# Patient Record
Sex: Male | Born: 1990 | Hispanic: Yes | Marital: Single | Smoking: Former smoker
Health system: Southern US, Community
[De-identification: ages and names within clinical notes are randomized; demographics above are authoritative.]

## PROBLEM LIST (undated history)

## (undated) HISTORY — PX: TESTICLE REMOVAL: SHX68

---

## 2019-04-29 ENCOUNTER — Other Ambulatory Visit: Payer: Self-pay

## 2019-04-29 ENCOUNTER — Encounter: Payer: Self-pay | Admitting: Emergency Medicine

## 2019-04-29 ENCOUNTER — Emergency Department
Admission: EM | Admit: 2019-04-29 | Discharge: 2019-04-30 | Disposition: A | Discharge status: Home / Self Care | Payer: BC Managed Care – PPO | Attending: Emergency Medicine | Admitting: Emergency Medicine

## 2019-04-29 ENCOUNTER — Emergency Department: Payer: BC Managed Care – PPO

## 2019-04-29 DIAGNOSIS — K219 Gastro-esophageal reflux disease without esophagitis: Secondary | ICD-10-CM | POA: Insufficient documentation

## 2019-04-29 DIAGNOSIS — R079 Chest pain, unspecified: Secondary | ICD-10-CM

## 2019-04-29 DIAGNOSIS — R0789 Other chest pain: Secondary | ICD-10-CM | POA: Diagnosis present

## 2019-04-29 LAB — BASIC METABOLIC PANEL
Anion gap: 10 (ref 5–15)
BUN: 12 mg/dL (ref 6–20)
CO2: 22 mmol/L (ref 22–32)
Calcium: 9.2 mg/dL (ref 8.9–10.3)
Chloride: 105 mmol/L (ref 98–111)
Creatinine, Ser: 0.9 mg/dL (ref 0.61–1.24)
GFR calc Af Amer: >60 mL/min (ref >60)
GFR calc non Af Amer: >60 mL/min (ref >60)
Glucose, Bld: 102 mg/dL — ABNORMAL HIGH (ref 70–99)
Potassium: 3.9 mmol/L (ref 3.5–5.1)
Sodium: 137 mmol/L (ref 135–145)

## 2019-04-29 LAB — CBC
HCT: 51.1 % (ref 39.0–52.0)
Hemoglobin: 17.8 g/dL — ABNORMAL HIGH (ref 13.0–17.0)
MCH: 30.5 pg (ref 26.0–34.0)
MCHC: 34.8 g/dL (ref 30.0–36.0)
MCV: 87.5 fL (ref 80.0–100.0)
Platelets: 290 10*3/uL (ref 150–400)
RBC: 5.84 MIL/uL — ABNORMAL HIGH (ref 4.22–5.81)
RDW: 12 % (ref 11.5–15.5)
WBC: 10.4 10*3/uL (ref 4.0–10.5)
nRBC: 0 % (ref 0.0–0.2)

## 2019-04-29 LAB — TROPONIN I (HIGH SENSITIVITY): Troponin I (High Sensitivity): 3 ng/L (ref <18)

## 2019-04-29 MED ORDER — SODIUM CHLORIDE 0.9 % IV BOLUS
1000.0000 mL | Freq: Once | INTRAVENOUS | Status: AC
  Administered 2019-04-30: 1000 mL via INTRAVENOUS

## 2019-04-29 NOTE — ED Triage Notes (Signed)
Pt arrives with complaints  of mid sternum chest pain that started 3 weeks prior. Pt reports today he felt light headed and short of breath.

## 2019-04-29 NOTE — ED Provider Notes (Signed)
[facility] Emergency Department Provider Note   ____________________________________________   None    (approximate)  I have reviewed the triage vital signs and the nursing notes.   HISTORY  Chief Complaint Chest Pain    HPI Phillip Rose is a 28 y.o. male who presents to the ED from home with a chief complaint of chest pain. Symptoms x 3 weeks; sharp substernal chest pain radiating to the right side. Single episode of vomiting with bloody streaks 2 weeks ago.  Seems to be worse after eating spicy foods.  Occasional cough. Denies fever, sob, abdominal pain, dysuria, diarrhea.       Past Medical History None   There are no active problems to display for this patient.   Past Surgical History:  Procedure Laterality Date  . TESTICLE REMOVAL      Prior to Admission medications   Not on File    Allergies Patient has no allergy information on record.  No family history on file.  Social History Social History   Tobacco Use  . Smoking status: Not on file  Substance Use Topics  . Alcohol use: Not on file  . Drug use: Not on file    Review of Systems  Constitutional: No fever/chills Eyes: No visual changes. ENT: No sore throat. Cardiovascular: Positive for chest pain. Respiratory: Denies shortness of breath. Gastrointestinal: No abdominal pain.  No nausea, no vomiting.  No diarrhea.  No constipation. Genitourinary: Negative for dysuria. Musculoskeletal: Negative for back pain. Skin: Negative for rash. Neurological: Negative for headaches, focal weakness or numbness.   ____________________________________________   PHYSICAL EXAM:  VITAL SIGNS: ED Triage Vitals  Enc Vitals Group     BP 04/29/19 2026 (!) 148/83     Pulse Rate 04/29/19 2026 84     Resp 04/29/19 2026 17     Temp 04/29/19 2026 99 F (37.2 C)     Temp Source 04/29/19 2026 Oral     SpO2 04/29/19 2026 98 %     Weight 04/29/19 2023 210 lb (95.3 kg)     Height  04/29/19 2023 5\' 5"  (1.651 m)     Head Circumference --      Peak Flow --      Pain Score 04/29/19 2022 7     Pain Loc --      Pain Edu? --      Excl. in GC? --     Constitutional: Alert and oriented. Well appearing and in no acute distress. Eyes: Conjunctivae are normal. PERRL. EOMI. Head: Atraumatic. Nose: No congestion/rhinnorhea. Mouth/Throat: Mucous membranes are moist.  Oropharynx non-erythematous. Neck: No stridor.   Cardiovascular: Normal rate, regular rhythm. Grossly normal heart sounds.  Good peripheral circulation. Respiratory: Normal respiratory effort.  No retractions. Lungs CTAB. Gastrointestinal: Soft and nontender. No distention. No abdominal bruits. No CVA tenderness. Musculoskeletal: No lower extremity tenderness nor edema.  No joint effusions. Neurologic:  Normal speech and language. No gross focal neurologic deficits are appreciated. No gait instability. Skin:  Skin is warm, dry and intact. No rash noted. Psychiatric: Mood and affect are normal. Speech and behavior are normal.  ____________________________________________   LABS (all labs ordered are listed, but only abnormal results are displayed)  Labs Reviewed  BASIC METABOLIC PANEL - Abnormal; Notable for the following components:      Result Value   Glucose, Bld 102 (*)    All other components within normal limits  CBC - Abnormal; Notable for the following components:   RBC  5.84 (*)    Hemoglobin 17.8 (*)    All other components within normal limits  HEPATIC FUNCTION PANEL - Abnormal; Notable for the following components:   Total Protein 8.6 (*)    ALT 46 (*)    All other components within normal limits  LIPASE, BLOOD  TROPONIN I (HIGH SENSITIVITY)  TROPONIN I (HIGH SENSITIVITY)   ____________________________________________  EKG  ED ECG REPORT I, SUNG,JADE J, the attending physician, personally viewed and interpreted this ECG.   Date: 04/30/2019  EKG Time: 2024  Rate: 107  Rhythm: sinus  tachycardia  Axis: Normal  Intervals:none  ST&T Change: Nonspecific  ____________________________________________  RADIOLOGY  ED MD interpretation: No acute cardiopulmonary process; no PE  Official radiology report(s): Ct Angio Chest Pe W/cm &/or Wo Cm  Result Date: 04/30/2019 CLINICAL DATA:  Chest pain and lightheadedness. EXAM: CT ANGIOGRAPHY CHEST WITH CONTRAST TECHNIQUE: Multidetector CT imaging of the chest was performed using the standard protocol during bolus administration of intravenous contrast. Multiplanar CT image reconstructions and MIPs were obtained to evaluate the vascular anatomy. CONTRAST:  1102mL OMNIPAQUE IOHEXOL 350 MG/ML SOLN COMPARISON:  None. FINDINGS: Cardiovascular: Contrast injection is sufficient to demonstrate satisfactory opacification of the pulmonary arteries to the segmental level. There is no pulmonary embolus. The main pulmonary artery is within normal limits for size. There is no CT evidence of acute right heart strain. The visualized aorta is normal. Heart size is normal, without pericardial effusion. Mediastinum/Nodes: --No mediastinal or hilar lymphadenopathy. --No axillary lymphadenopathy. --No supraclavicular lymphadenopathy. --Normal thyroid gland. --The esophagus is unremarkable Lungs/Pleura: There are multiple calcified granulomas in the right upper lobe. There is no pneumothorax. No pleural effusion. No focal infiltrate. Upper Abdomen: No acute abnormality. Musculoskeletal: No chest wall abnormality. No acute or significant osseous findings. Review of the MIP images confirms the above findings. IMPRESSION: 1. No pulmonary embolism. 2. No acute abnormality detected. Electronically Signed   By: Constance Holster M.D.   On: 04/30/2019 00:48   Dg Chest Port 1 View  Result Date: 04/29/2019 CLINICAL DATA:  Chest pain EXAM: PORTABLE CHEST 1 VIEW COMPARISON:  None. FINDINGS: The heart size and mediastinal contours are within normal limits. Both lungs are  clear. The visualized skeletal structures are unremarkable. IMPRESSION: No active disease. Electronically Signed   By: Ulyses Jarred M.D.   On: 04/29/2019 23:57    ____________________________________________   PROCEDURES  Procedure(s) performed (including Critical Care):  Procedures   ____________________________________________   INITIAL IMPRESSION / ASSESSMENT AND PLAN / ED COURSE  As part of my medical decision making, I reviewed the following data within the [facility] notes reviewed and incorporated, Labs reviewed, EKG interpreted, Old chart reviewed, Radiograph reviewed and Notes from prior ED visits     Phillip Rose was evaluated in Emergency Department on 04/30/2019 for the symptoms described in the history of present illness. He was evaluated in the context of the global COVID-19 pandemic, which necessitated consideration that the patient might be at risk for infection with the SARS-CoV-2 virus that causes COVID-19. Institutional protocols and algorithms that pertain to the evaluation of patients at risk for COVID-19 are in a state of rapid change based on information released by regulatory bodies including the CDC and federal and state organizations. These policies and algorithms were followed during the patient's care in the ED.    28 year old male who presents with a 3-week history of chest pain. Differential diagnosis includes, but is not limited to, ACS, aortic dissection, pulmonary embolism,  cardiac tamponade, pneumothorax, pneumonia, pericarditis, myocarditis, GI-related causes including esophagitis/gastritis, and musculoskeletal chest wall pain.    Will check LFTs, lipase.  Administer IV fluids, IV Pepcid.  Obtain CT chest to evaluate for PE.   Clinical Course as of Apr 30 131  Fri Apr 29, 2019  2215 WBC: 10.4 [KP]  Sat Apr 30, 2019  0131 Updated patient and his mother of all test results.  Will discharge home on Pepcid as needed.  Strict  return precautions given.  Both verbalized understanding and agree with plan of care.   [JS]    Clinical Course User Index [JS] Irean Hong, MD [KP] Minna Antis, MD     ____________________________________________   FINAL CLINICAL IMPRESSION(S) / ED DIAGNOSES  Final diagnoses:  Nonspecific chest pain  Gastroesophageal reflux disease, unspecified whether esophagitis present     ED Discharge Orders    None       Note:  This document was prepared using Dragon voice recognition software and may include unintentional dictation errors.   Irean Hong, MD 04/30/19 [phone]

## 2019-04-30 ENCOUNTER — Emergency Department: Payer: BC Managed Care – PPO

## 2019-04-30 LAB — HEPATIC FUNCTION PANEL
ALT: 46 U/L — ABNORMAL HIGH (ref 0–44)
AST: 25 U/L (ref 15–41)
Albumin: 4.7 g/dL (ref 3.5–5.0)
Alkaline Phosphatase: 74 U/L (ref 38–126)
Bilirubin, Direct: 0.1 mg/dL (ref 0.0–0.2)
Indirect Bilirubin: 0.7 mg/dL (ref 0.3–0.9)
Total Bilirubin: 0.8 mg/dL (ref 0.3–1.2)
Total Protein: 8.6 g/dL — ABNORMAL HIGH (ref 6.5–8.1)

## 2019-04-30 LAB — TROPONIN I (HIGH SENSITIVITY): Troponin I (High Sensitivity): 3 ng/L (ref <18)

## 2019-04-30 LAB — LIPASE, BLOOD: Lipase: 34 U/L (ref 11–51)

## 2019-04-30 MED ORDER — IOHEXOL 350 MG/ML SOLN
100.0000 mL | Freq: Once | INTRAVENOUS | Status: AC | PRN
  Administered 2019-04-30: 100 mL via INTRAVENOUS

## 2019-04-30 MED ORDER — FAMOTIDINE 20 MG PO TABS: 20.0000 mg | ORAL_TABLET | Freq: Two times a day (BID) | ORAL | 0 refills | Status: DC

## 2019-04-30 MED ORDER — FAMOTIDINE IN NACL 20-0.9 MG/50ML-% IV SOLN
20.0000 mg | Freq: Once | INTRAVENOUS | Status: AC
  Administered 2019-04-30: 20 mg via INTRAVENOUS
  Filled 2019-04-30: qty 50

## 2019-04-30 NOTE — ED Notes (Signed)
Family at bedside. 

## 2019-04-30 NOTE — ED Notes (Signed)
Fluids to be finished prior to DC - approx 262mls remain

## 2019-04-30 NOTE — Discharge Instructions (Addendum)
1.  Start Pepcid 20 mg twice daily. 2.  Return to the ER for worsening symptoms, persistent vomiting, difficulty breathing or other concerns.

## 2020-07-24 ENCOUNTER — Emergency Department
Admission: EM | Admit: 2020-07-24 | Discharge: 2020-07-24 | Discharge status: Against Medical Advice | Payer: BC Managed Care – PPO | Attending: Student in an Organized Health Care Education/Training Program | Admitting: Student in an Organized Health Care Education/Training Program

## 2020-07-24 ENCOUNTER — Other Ambulatory Visit: Payer: Self-pay

## 2020-07-24 ENCOUNTER — Encounter: Payer: Self-pay | Admitting: *Deleted

## 2020-07-24 ENCOUNTER — Emergency Department: Payer: BC Managed Care – PPO

## 2020-07-24 DIAGNOSIS — R1012 Left upper quadrant pain: Secondary | ICD-10-CM | POA: Diagnosis present

## 2020-07-24 LAB — COMPREHENSIVE METABOLIC PANEL
ALT: 50 U/L — ABNORMAL HIGH (ref 0–44)
AST: 28 U/L (ref 15–41)
Albumin: 4.3 g/dL (ref 3.5–5.0)
Alkaline Phosphatase: 71 U/L (ref 38–126)
Anion gap: 9 (ref 5–15)
BUN: 14 mg/dL (ref 6–20)
CO2: 29 mmol/L (ref 22–32)
Calcium: 9.4 mg/dL (ref 8.9–10.3)
Chloride: 103 mmol/L (ref 98–111)
Creatinine, Ser: 1.04 mg/dL (ref 0.61–1.24)
GFR, Estimated: >60 mL/min (ref >60)
Glucose, Bld: 96 mg/dL (ref 70–99)
Potassium: 4 mmol/L (ref 3.5–5.1)
Sodium: 141 mmol/L (ref 135–145)
Total Bilirubin: 1 mg/dL (ref 0.3–1.2)
Total Protein: 8 g/dL (ref 6.5–8.1)

## 2020-07-24 LAB — URINALYSIS, COMPLETE (UACMP) WITH MICROSCOPIC
Bacteria, UA: NONE SEEN
Bilirubin Urine: NEGATIVE
Glucose, UA: NEGATIVE mg/dL
Hgb urine dipstick: NEGATIVE
Ketones, ur: NEGATIVE mg/dL
Leukocytes,Ua: NEGATIVE
Nitrite: NEGATIVE
Protein, ur: NEGATIVE mg/dL
Specific Gravity, Urine: 1.025 (ref 1.005–1.030)
Squamous Epithelial / HPF: NONE SEEN (ref 0–5)
pH: 6 (ref 5.0–8.0)

## 2020-07-24 LAB — CBC
HCT: 49.4 % (ref 39.0–52.0)
Hemoglobin: 17.1 g/dL — ABNORMAL HIGH (ref 13.0–17.0)
MCH: 30.9 pg (ref 26.0–34.0)
MCHC: 34.6 g/dL (ref 30.0–36.0)
MCV: 89.2 fL (ref 80.0–100.0)
Platelets: 283 10*3/uL (ref 150–400)
RBC: 5.54 MIL/uL (ref 4.22–5.81)
RDW: 11.7 % (ref 11.5–15.5)
WBC: 7.9 10*3/uL (ref 4.0–10.5)
nRBC: 0 % (ref 0.0–0.2)

## 2020-07-24 LAB — LIPASE, BLOOD: Lipase: 36 U/L (ref 11–51)

## 2020-07-24 MED ORDER — FAMOTIDINE 20 MG PO TABS: 20.0000 mg | ORAL_TABLET | Freq: Two times a day (BID) | ORAL | 0 refills | Status: DC

## 2020-07-24 MED ORDER — ALUM & MAG HYDROXIDE-SIMETH 200-200-20 MG/5ML PO SUSP
30.0000 mL | Freq: Once | ORAL | Status: DC
  Filled 2020-07-24: qty 30

## 2020-07-24 MED ORDER — LIDOCAINE VISCOUS HCL 2 % MT SOLN
15.0000 mL | Freq: Once | OROMUCOSAL | Status: DC
  Filled 2020-07-24: qty 15

## 2020-07-24 NOTE — ED Provider Notes (Signed)
[facility] Emergency Department Provider Note  ____________________________________________  Time seen: Approximately 9:39 PM  I have reviewed the triage vital signs and the nursing notes.   HISTORY  Chief Complaint Abdominal Pain    HPI Phillip Rose is a 30 y.o. male that presents to the emergency department for evaluation of left upper quadrant pain for 3 to 4 days.  Pain is worse when he breathes.  It has been intermittent all day.  He is unaware of any fevers.  No coughing, shortness of breath, chest pain, nausea, vomiting, diarrhea.  No past medical history on file.  There are no problems to display for this patient.   Past Surgical History:  Procedure Laterality Date  . TESTICLE REMOVAL      Prior to Admission medications   Medication Sig Start Date End Date Taking? Authorizing Provider  famotidine (PEPCID) 20 MG tablet Take 1 tablet (20 mg total) by mouth 2 (two) times daily. 07/24/20 07/24/21 Yes Enid Derry, PA-C    Allergies Patient has no known allergies.  No family history on file.  Social History Social History   Tobacco Use  . Smoking status: Never Smoker  . Smokeless tobacco: Never Used  Substance Use Topics  . Alcohol use: Not Currently  . Drug use: Not Currently     Review of Systems  Constitutional: No fever/chills ENT: No upper respiratory complaints. Cardiovascular: No chest pain. Respiratory: No cough. No SOB. Gastrointestinal: Positive for left upper quadrant pain.  No nausea, no vomiting.  Musculoskeletal: Negative for musculoskeletal pain. Skin: Negative for rash, abrasions, lacerations, ecchymosis. Neurological: Negative for headaches   ____________________________________________   PHYSICAL EXAM:  VITAL SIGNS: ED Triage Vitals  Enc Vitals Group     BP 07/24/20 1905 (!) 123/96     Pulse Rate 07/24/20 1905 84     Resp 07/24/20 1905 18     Temp 07/24/20 1905 98.1 F (36.7 C)     Temp Source  07/24/20 1905 Oral     SpO2 07/24/20 1905 100 %     Weight 07/24/20 1905 210 lb (95.3 kg)     Height 07/24/20 1905 5\' 5"  (1.651 m)     Head Circumference --      Peak Flow --      Pain Score 07/24/20 1904 4     Pain Loc --      Pain Edu? --      Excl. in GC? --      Constitutional: Alert and oriented. Well appearing and in no acute distress. Eyes: Conjunctivae are normal. PERRL. EOMI. Head: Atraumatic. ENT:      Ears:      Nose: No congestion/rhinnorhea.      Mouth/Throat: Mucous membranes are moist.  Neck: No stridor.   Cardiovascular: Normal rate, regular rhythm.  Good peripheral circulation. Respiratory: Normal respiratory effort without tachypnea or retractions. Lungs CTAB. Good air entry to the bases with no decreased or absent breath sounds. Gastrointestinal: Bowel sounds 4 quadrants.  Mild left upper quadrant tenderness. No guarding or rigidity. No palpable masses. No distention. Musculoskeletal: Full range of motion to all extremities. No gross deformities appreciated. Neurologic:  Normal speech and language. No gross focal neurologic deficits are appreciated.  Skin:  Skin is warm, dry and intact. No rash noted. Psychiatric: Mood and affect are normal. Speech and behavior are normal. Patient exhibits appropriate insight and judgement.   ____________________________________________   LABS (all labs ordered are listed, but only abnormal results are displayed)  Labs Reviewed  COMPREHENSIVE METABOLIC PANEL - Abnormal; Notable for the following components:      Result Value   ALT 50 (*)    All other components within normal limits  CBC - Abnormal; Notable for the following components:   Hemoglobin 17.1 (*)    All other components within normal limits  URINALYSIS, COMPLETE (UACMP) WITH MICROSCOPIC - Abnormal; Notable for the following components:   Color, Urine YELLOW (*)    APPearance HAZY (*)    All other components within normal limits  LIPASE, BLOOD  TROPONIN I  (HIGH SENSITIVITY)  TROPONIN I (HIGH SENSITIVITY)   ____________________________________________  EKG   ____________________________________________  RADIOLOGY   DG Chest 2 View  Result Date: 07/24/2020 CLINICAL DATA:  Left upper abdominal pain for 3 days. EXAM: CHEST - 2 VIEW COMPARISON:  Chest 04/29/2019.  CT chest 04/30/2019 FINDINGS: Slightly shallow inspiration. Heart size and pulmonary vascularity are normal. No airspace disease or consolidation in the lungs. Calcified granuloma in the right upper lung is unchanged. No pleural effusions. No pneumothorax. Mediastinal contours appear intact. IMPRESSION: No active cardiopulmonary disease. Electronically Signed   By: Burman Nieves M.D.   On: 07/24/2020 21:59    ____________________________________________    PROCEDURES  Procedure(s) performed:    Procedures    Medications  alum & mag hydroxide-simeth (MAALOX/MYLANTA) 200-200-20 MG/5ML suspension 30 mL (30 mLs Oral Not Given 07/24/20 2147)    And  lidocaine (XYLOCAINE) 2 % viscous mouth solution 15 mL (15 mLs Oral Not Given 07/24/20 2147)     ____________________________________________   INITIAL IMPRESSION / ASSESSMENT AND PLAN / ED COURSE  Pertinent labs & imaging results that were available during my care of the patient were reviewed by me and considered in my medical decision making (see chart for details).  Review of the [place] CSRS was performed in accordance of the [facility] prior to dispensing any controlled drugs.   Differential diagnosis includes, but is not limited to, biliary disease (biliary colic, acute cholecystitis, cholangitis, choledocholithiasis, etc), intrathoracic causes for epigastric abdominal pain including ACS, gastritis, duodenitis, pancreatitis, small bowel or large bowel obstruction, abdominal aortic aneurysm, hernia, and ulcer(s).  Patient left AGAINST MEDICAL ADVICE.  Patient presented to emergency department for evaluation of left upper  quadrant pain for 4 days.  Vital signs and exam are reassuring.  Initial lab work is largely unremarkable.  No indication of infection on urinalysis.  Chest x-ray negative for acute cardiopulmonary processes.  Patient declines any additional lab work, EKG or CT imaging.  Patient states that he needs to leave because he has to go to work.  He declines any medications or further work-up.  Patient will return if any symptoms worsen.  He will begin a trial of Pepcid.  Patient signed out AGAINST MEDICAL ADVICE.     Phillip Rose was evaluated in Emergency Department on 07/24/2020 for the symptoms described in the history of present illness. He was evaluated in the context of the global COVID-19 pandemic, which necessitated consideration that the patient might be at risk for infection with the SARS-CoV-2 virus that causes COVID-19. Institutional protocols and algorithms that pertain to the evaluation of patients at risk for COVID-19 are in a state of rapid change based on information released by regulatory bodies including the CDC and federal and state organizations. These policies and algorithms were followed during the patient's care in the ED.  ____________________________________________  FINAL CLINICAL IMPRESSION(S) / ED DIAGNOSES  Final diagnoses:  Left upper quadrant abdominal pain  NEW MEDICATIONS STARTED DURING THIS VISIT:  ED Discharge Orders         Ordered    famotidine (PEPCID) 20 MG tablet  2 times daily        07/24/20 2227              This chart was dictated using voice recognition software/Dragon. Despite best efforts to proofread, errors can occur which can change the meaning. Any change was purely unintentional.    Enid Derry, PA-C 07/24/20 2354    Chesley Noon, MD 07/25/20 [phone]

## 2020-07-24 NOTE — ED Notes (Signed)
Pt left AMA before RN could see pt or have pt sign AMA form.

## 2020-07-24 NOTE — ED Notes (Signed)
No answer when called several times from lobby 

## 2020-07-24 NOTE — ED Triage Notes (Signed)
Pt ambulatory to triage.  Pt has left upper abd pain for 3 days.  No n/v/d. No back pain.  Pt alert  Speech clear.

## 2020-07-24 NOTE — ED Notes (Signed)
Pt returns to STAT desk, st waiting outside and cell phone # listed in chart is incorrect

## 2020-07-24 NOTE — ED Notes (Signed)
No answer when called several times from lobby, no answer when cell phone # listed in chart called 

## 2020-07-25 LAB — TROPONIN I (HIGH SENSITIVITY): Troponin I (High Sensitivity): 3 ng/L (ref <18)

## 2022-03-05 ENCOUNTER — Encounter: Payer: Self-pay | Admitting: Emergency Medicine

## 2022-03-05 ENCOUNTER — Other Ambulatory Visit: Payer: Self-pay

## 2022-03-05 ENCOUNTER — Emergency Department
Admission: EM | Admit: 2022-03-05 | Discharge: 2022-03-05 | Disposition: A | Discharge status: Home / Self Care | Payer: BC Managed Care – PPO | Attending: Emergency Medicine | Admitting: Emergency Medicine

## 2022-03-05 DIAGNOSIS — H6123 Impacted cerumen, bilateral: Secondary | ICD-10-CM | POA: Insufficient documentation

## 2022-03-05 DIAGNOSIS — H6120 Impacted cerumen, unspecified ear: Secondary | ICD-10-CM

## 2022-03-05 NOTE — ED Provider Notes (Signed)
   [facility] Provider Note    Event Date/Time   First MD Initiated Contact with Patient 03/05/22 [phone]     (approximate)   History   Ear Fullness   HPI  Phillip Rose is a 31 y.o. male who presents to the ED for evaluation of Ear Fullness   Patient presents to the ED requesting a cleanout of his ear canals because he has been using Q-tips to try to clean his ears, but they have been feeling more full.  Denies fevers, pain, congestion, rhinorrhea, sore throat  Physical Exam   Triage Vital Signs: ED Triage Vitals  Enc Vitals Group     BP 03/05/22 0446 (!) 166/105     Pulse Rate 03/05/22 0446 93     Resp 03/05/22 0446 18     Temp 03/05/22 0446 98 F (36.7 C)     Temp Source 03/05/22 0446 Oral     SpO2 03/05/22 0446 98 %     Weight 03/05/22 0442 260 lb (117.9 kg)     Height 03/05/22 0442 5\' 4"  (1.626 m)     Head Circumference --      Peak Flow --      Pain Score 03/05/22 0442 0     Pain Loc --      Pain Edu? --      Excl. in GC? --     Most recent vital signs: Vitals:   03/05/22 0446  BP: (!) 166/105  Pulse: 93  Resp: 18  Temp: 98 F (36.7 C)  SpO2: 98%    General: Awake, no distress.  CV:  Good peripheral perfusion.  Resp:  Normal effort.  Abd:  No distention.  MSK:  No deformity noted.  Neuro:  No focal deficits appreciated. Other:  Cerumen to bilateral external auditory canals clouding of the view of TM.  No pain or tenderness with otoscope insertion   ED Results / Procedures / Treatments   Labs (all labs ordered are listed, but only abnormal results are displayed) Labs Reviewed - No data to display  EKG   RADIOLOGY   Official radiology report(s): No results found.  PROCEDURES and INTERVENTIONS:  Procedures  Medications - No data to display   IMPRESSION / MDM / ASSESSMENT AND PLAN / ED COURSE  I reviewed the triage vital signs and the nursing notes.  Differential diagnosis includes, but is not limited to,  cerumen impaction, AOM, swimmers ear  31 year old male presents to the ED with cerumen to his bilateral auditory canals requesting a cleanout.  We provided patient resources to do so himself and discussed return precautions.     FINAL CLINICAL IMPRESSION(S) / ED DIAGNOSES   Final diagnoses:  Cerumen in auditory canal on examination     Rx / DC Orders   ED Discharge Orders     None        Note:  This document was prepared using Dragon voice recognition software and may include unintentional dictation errors.   38, MD 03/05/22 [phone]

## 2022-03-05 NOTE — ED Notes (Signed)
Pt not available for rpt VS prior to DC.

## 2022-03-05 NOTE — ED Triage Notes (Signed)
Patient ambulatory to triage with steady gait, without difficulty or distress noted; pt reports that his ears are stopped up this morning; denies pain, denies any recent illness, denies accomp symptoms

## 2022-03-24 IMAGING — CR DG CHEST 2V
1 series · 2 of 2 positions shown · non-contrast
Comparison: Chest 04/29/2019.  CT chest 04/30/2019

CLINICAL DATA: Left upper abdominal pain for 3 days.

EXAM:
CHEST - 2 VIEW

[Series 1: dg chest 2 view · 0.14mm/px · 2 of 2 slices shown]
[im 1/2]
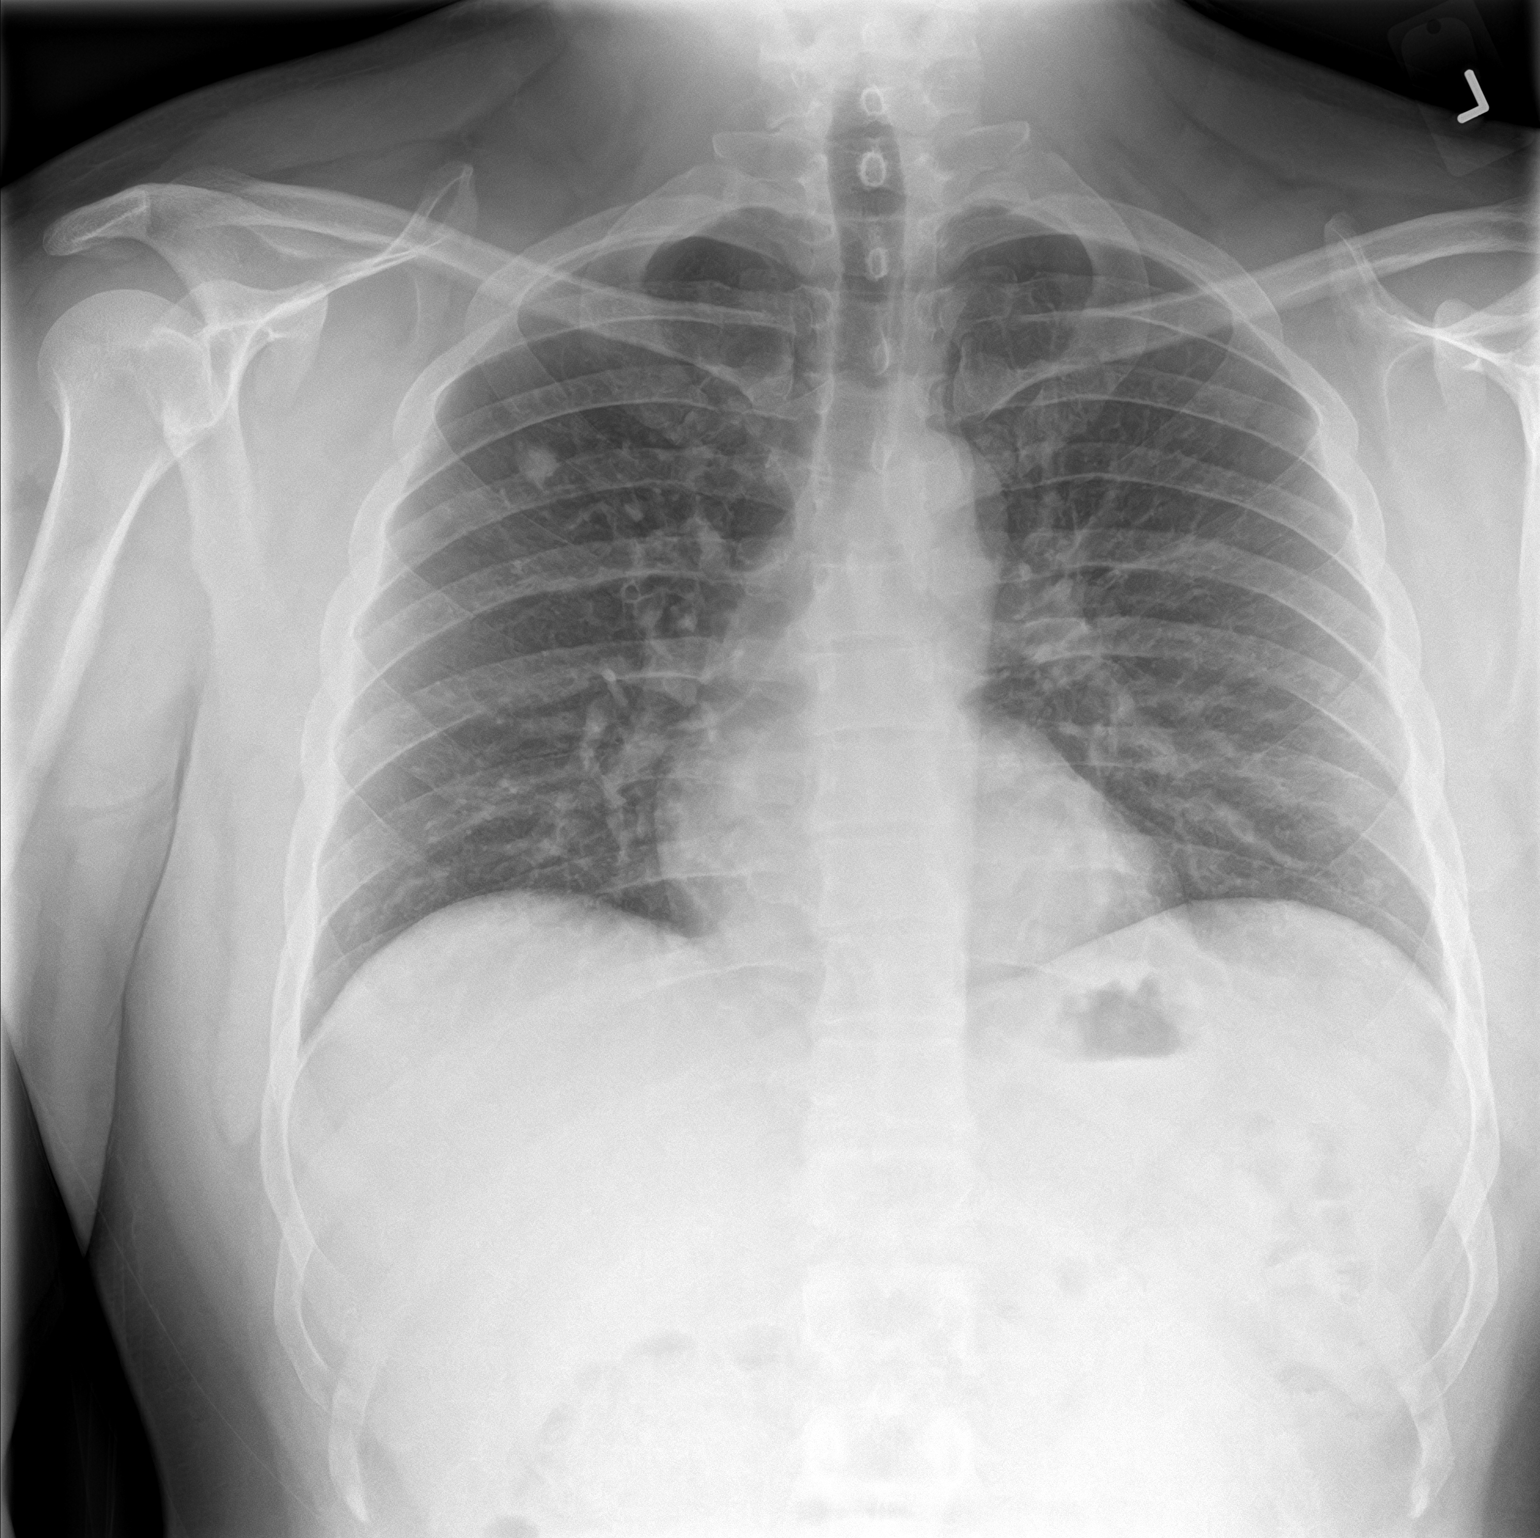
[im 2/2]
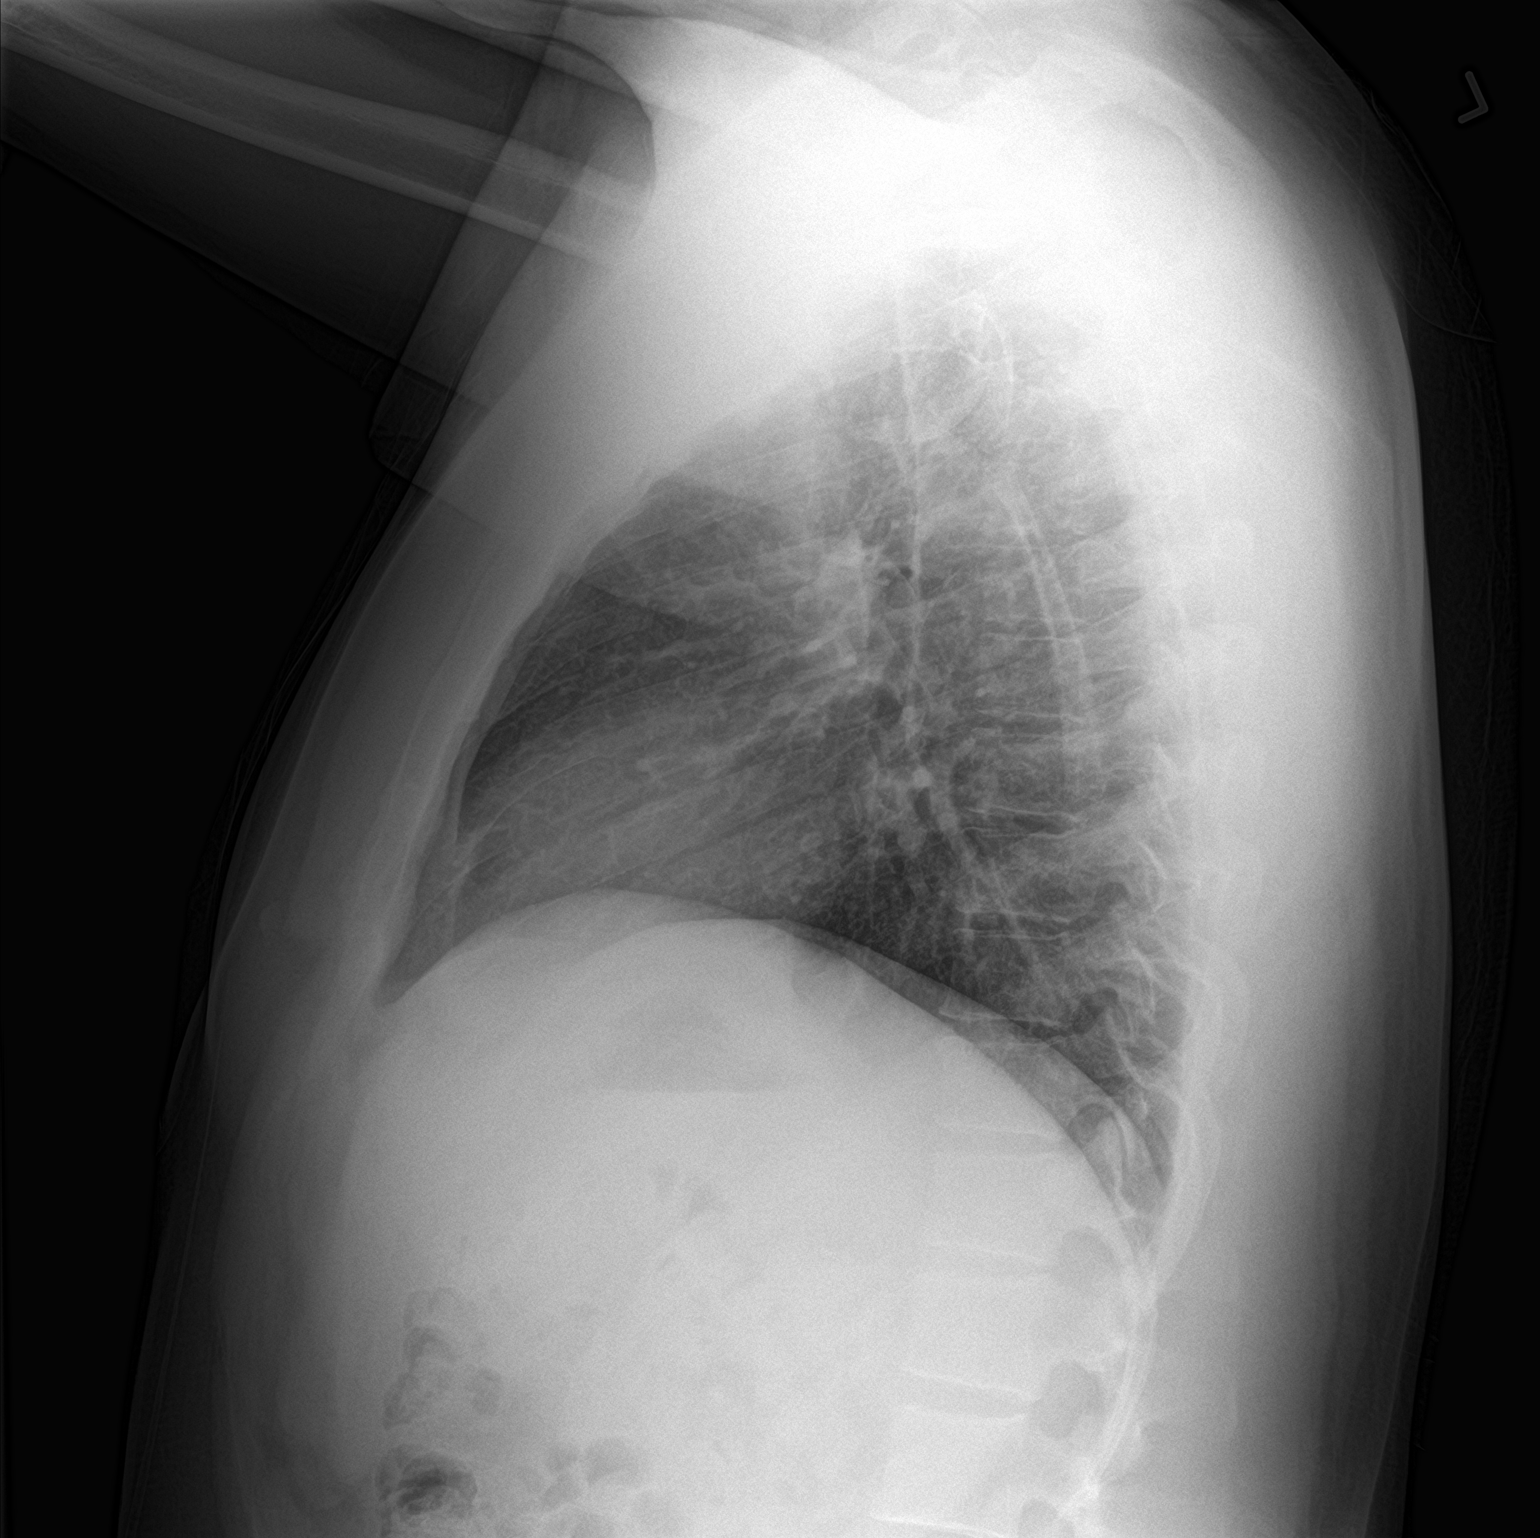

[2 of 2 positions shown; findings below may reference images not displayed]

FINDINGS: Slightly shallow inspiration. Heart size and pulmonary vascularity
are normal. No airspace disease or consolidation in the lungs.
Calcified granuloma in the right upper lung is unchanged. No pleural
effusions. No pneumothorax. Mediastinal contours appear intact.
IMPRESSION: No active cardiopulmonary disease.

## 2023-03-25 ENCOUNTER — Encounter: Payer: Self-pay | Admitting: *Deleted

## 2023-03-25 ENCOUNTER — Other Ambulatory Visit: Payer: Self-pay

## 2023-03-25 ENCOUNTER — Emergency Department: Payer: Managed Care, Other (non HMO)

## 2023-03-25 ENCOUNTER — Emergency Department
Admission: EM | Admit: 2023-03-25 | Discharge: 2023-03-25 | Disposition: A | Discharge status: Home / Self Care | Payer: Managed Care, Other (non HMO) | Attending: Emergency Medicine | Admitting: Emergency Medicine

## 2023-03-25 DIAGNOSIS — F419 Anxiety disorder, unspecified: Secondary | ICD-10-CM | POA: Insufficient documentation

## 2023-03-25 DIAGNOSIS — R0602 Shortness of breath: Secondary | ICD-10-CM | POA: Diagnosis present

## 2023-03-25 DIAGNOSIS — R1013 Epigastric pain: Secondary | ICD-10-CM | POA: Diagnosis not present

## 2023-03-25 LAB — CBC
HCT: 51.6 % (ref 39.0–52.0)
Hemoglobin: 17.6 g/dL — ABNORMAL HIGH (ref 13.0–17.0)
MCH: 30.2 pg (ref 26.0–34.0)
MCHC: 34.1 g/dL (ref 30.0–36.0)
MCV: 88.7 fL (ref 80.0–100.0)
Platelets: 274 10*3/uL (ref 150–400)
RBC: 5.82 MIL/uL — ABNORMAL HIGH (ref 4.22–5.81)
RDW: 11.9 % (ref 11.5–15.5)
WBC: 11.3 10*3/uL — ABNORMAL HIGH (ref 4.0–10.5)
nRBC: 0 % (ref 0.0–0.2)

## 2023-03-25 LAB — BASIC METABOLIC PANEL
Anion gap: 8 (ref 5–15)
BUN: 16 mg/dL (ref 6–20)
CO2: 26 mmol/L (ref 22–32)
Calcium: 9.3 mg/dL (ref 8.9–10.3)
Chloride: 102 mmol/L (ref 98–111)
Creatinine, Ser: 1.17 mg/dL (ref 0.61–1.24)
GFR, Estimated: >60 mL/min (ref >60)
Glucose, Bld: 94 mg/dL (ref 70–99)
Potassium: 3.6 mmol/L (ref 3.5–5.1)
Sodium: 136 mmol/L (ref 135–145)

## 2023-03-25 LAB — TROPONIN I (HIGH SENSITIVITY): Troponin I (High Sensitivity): 4 ng/L (ref <18)

## 2023-03-25 MED ORDER — FAMOTIDINE 20 MG PO TABS: 20.0000 mg | ORAL_TABLET | Freq: Two times a day (BID) | ORAL | 0 refills | Status: AC

## 2023-03-25 NOTE — ED Notes (Signed)
Pt was taken to xray

## 2023-03-25 NOTE — ED Triage Notes (Signed)
Pt felt sob and like his heart was racing when he woke up this am. Pt was evaluated by ems and came here POV.

## 2023-03-25 NOTE — ED Provider Notes (Signed)
[facility] Provider Note   Event Date/Time   First MD Initiated Contact with Patient 03/25/23 [phone]     (approximate) History  Shortness of Breath  HPI Phillip Rose is a 32 y.o. male with no stated past medical history who presents complaining of shortness of breath a taking.  He began this morning.  Patient states that he has an intermittent episodes of similar symptoms in the past that he feels is secondary to anxiety.  Patient states that he has had significant amounts of anxiety that is worsening over the past week as the symptoms have been worsening.  Patient denies any other exacerbating or relieving factors specifically no worsening on exertion.  Patient denies any personal or family history of early cardiac disease. ROS: Patient currently denies any vision changes, tinnitus, difficulty speaking, facial droop, sore throat, abdominal pain, nausea/vomiting/diarrhea, dysuria, or weakness/numbness/paresthesias in any extremity   Physical Exam  Triage Vital Signs: ED Triage Vitals  Encounter Vitals Group     BP 03/25/23 0923 (!) 140/102     Systolic BP Percentile --      Diastolic BP Percentile --      Pulse Rate 03/25/23 0923 (!) 102     Resp 03/25/23 0923 (!) 22     Temp 03/25/23 0923 98.5 F (36.9 C)     Temp Source 03/25/23 0923 Oral     SpO2 03/25/23 0923 98 %     Weight 03/25/23 0923 250 lb (113.4 kg)     Height 03/25/23 0923 5\' 5"  (1.651 m)     Head Circumference --      Peak Flow --      Pain Score 03/25/23 0923 0     Pain Loc --      Pain Education --      Exclude from Growth Chart --    Most recent vital signs: Vitals:   03/25/23 0923 03/25/23 1126  BP: (!) 140/102 (!) 135/103  Pulse: (!) 102 97  Resp: (!) 22 16  Temp: 98.5 F (36.9 C)   SpO2: 98% 99%   General: Awake, oriented x4. CV:  Good peripheral perfusion.  Resp:  Normal effort.  Abd:  No distention.  Other:  Middle-aged obese Hispanic male resting comfortably in no acute  distress ED Results / Procedures / Treatments  Labs (all labs ordered are listed, but only abnormal results are displayed) Labs Reviewed  CBC - Abnormal; Notable for the following components:      Result Value   WBC 11.3 (*)    RBC 5.82 (*)    Hemoglobin 17.6 (*)    All other components within normal limits  BASIC METABOLIC PANEL  TROPONIN I (HIGH SENSITIVITY)   EKG ED ECG REPORT I, Merwyn Katos, the attending physician, personally viewed and interpreted this ECG. Date: 03/25/2023 EKG Time: 0927 Rate: 108 Rhythm: Tachycardic sinus rhythm QRS Axis: normal Intervals: normal ST/T Wave abnormalities: normal Narrative Interpretation: Tachycardic sinus rhythm.  No evidence of acute ischemia RADIOLOGY ED MD interpretation: 2 view chest x-ray interpreted by me shows no evidence of acute abnormalities including no pneumonia, pneumothorax, or widened mediastinum -Agree with radiology assessment Official radiology report(s): DG Chest 2 View  Result Date: 03/25/2023 CLINICAL DATA:  Provided history: Shortness of breath. Palpitations. Chest pain. EXAM: CHEST - 2 VIEW COMPARISON:  Prior chest radiographs 07/24/2020 and earlier. Chest CT 04/30/2019. FINDINGS: Heart size within normal limits. Redemonstrated calcified granuloma within the right upper lobe. No appreciable acute airspace consolidation.  No evidence of pleural effusion or pneumothorax. No acute osseous abnormality identified. IMPRESSION: No evidence of an acute cardiopulmonary abnormality. Electronically Signed   By: Jackey Loge D.O.   On: 03/25/2023 11:42   PROCEDURES: Critical Care performed: No .1-3 Lead EKG Interpretation  Performed by: Merwyn Katos, MD Authorized by: Merwyn Katos, MD     Interpretation: normal     ECG rate:  71   ECG rate assessment: normal     Rhythm: sinus rhythm     Ectopy: none     Conduction: normal    MEDICATIONS ORDERED IN ED: Medications - No data to display IMPRESSION / MDM /  ASSESSMENT AND PLAN / ED COURSE  I reviewed the triage vital signs and the nursing notes.                             The patient is on the cardiac monitor to evaluate for evidence of arrhythmia and/or significant heart rate changes. Patient's presentation is most consistent with acute presentation with potential threat to life or bodily function. This patient presents with atypical chest pain, most likely secondary to musculoskeletal injury. Differential diagnosis includes rib fracture, costochondritis, sternal fracture. Low suspicion for ACS, acute PE (PERC negative), pericarditis / myocarditis, thoracic aortic dissection, pneumothorax, pneumonia or other acute infectious process. Presentation not consistent with other acute, emergent causes of chest pain at this time. No indication for cardiac enzyme testing. Plan to order CXR to evaluate for acute cardiopulmonary causes.  Plan: EKG, CXR, pain control Upon further discussion with patient, patient is showing signs of GERD as well as increased anxiety and therefore will provide prescription for Pepcid, instructions for Tums prior to fatty, greasy, or spicy meals, and follow-up with outpatient psychiatry. Dispo: Discharge home with home care   FINAL CLINICAL IMPRESSION(S) / ED DIAGNOSES   Final diagnoses:  SOB (shortness of breath)  Epigastric pain  Anxiety   Rx / DC Orders   ED Discharge Orders          Ordered    famotidine (PEPCID) 20 MG tablet  2 times daily        03/25/23 1106           Note:  This document was prepared using Dragon voice recognition software and may include unintentional dictation errors.   Merwyn Katos, MD 03/26/23 [phone]

## 2023-03-25 NOTE — Discharge Instructions (Addendum)
You may also use Tums (calcium carbonate) chews/tablets as needed for continued heartburn

## 2023-04-05 ENCOUNTER — Emergency Department
Admission: EM | Admit: 2023-04-05 | Discharge: 2023-04-05 | Disposition: A | Discharge status: Home / Self Care | Payer: Managed Care, Other (non HMO) | Attending: Emergency Medicine | Admitting: Emergency Medicine

## 2023-04-05 ENCOUNTER — Emergency Department: Payer: Managed Care, Other (non HMO)

## 2023-04-05 ENCOUNTER — Encounter: Payer: Self-pay | Admitting: Intensive Care

## 2023-04-05 ENCOUNTER — Other Ambulatory Visit: Payer: Self-pay

## 2023-04-05 DIAGNOSIS — R110 Nausea: Secondary | ICD-10-CM | POA: Diagnosis not present

## 2023-04-05 DIAGNOSIS — R079 Chest pain, unspecified: Secondary | ICD-10-CM | POA: Insufficient documentation

## 2023-04-05 LAB — CBC
HCT: 49.9 % (ref 39.0–52.0)
Hemoglobin: 17.7 g/dL — ABNORMAL HIGH (ref 13.0–17.0)
MCH: 30.7 pg (ref 26.0–34.0)
MCHC: 35.5 g/dL (ref 30.0–36.0)
MCV: 86.6 fL (ref 80.0–100.0)
Platelets: 304 10*3/uL (ref 150–400)
RBC: 5.76 MIL/uL (ref 4.22–5.81)
RDW: 11.7 % (ref 11.5–15.5)
WBC: 8.3 10*3/uL (ref 4.0–10.5)
nRBC: 0 % (ref 0.0–0.2)

## 2023-04-05 LAB — BASIC METABOLIC PANEL
Anion gap: 14 (ref 5–15)
BUN: 14 mg/dL (ref 6–20)
CO2: 23 mmol/L (ref 22–32)
Calcium: 9.4 mg/dL (ref 8.9–10.3)
Chloride: 101 mmol/L (ref 98–111)
Creatinine, Ser: 1.2 mg/dL (ref 0.61–1.24)
GFR, Estimated: >60 mL/min (ref >60)
Glucose, Bld: 118 mg/dL — ABNORMAL HIGH (ref 70–99)
Potassium: 3.6 mmol/L (ref 3.5–5.1)
Sodium: 138 mmol/L (ref 135–145)

## 2023-04-05 LAB — TROPONIN I (HIGH SENSITIVITY): Troponin I (High Sensitivity): <2 ng/L (ref <18)

## 2023-04-05 MED ORDER — FAMOTIDINE 20 MG PO TABS
20.0000 mg | ORAL_TABLET | Freq: Once | ORAL | Status: AC
  Administered 2023-04-05: 20 mg via ORAL
  Filled 2023-04-05: qty 1

## 2023-04-05 MED ORDER — ALUM & MAG HYDROXIDE-SIMETH 200-200-20 MG/5ML PO SUSP
30.0000 mL | Freq: Once | ORAL | Status: AC
  Administered 2023-04-05: 30 mL via ORAL
  Filled 2023-04-05: qty 30

## 2023-04-05 MED ORDER — ACETAMINOPHEN 500 MG PO TABS
1000.0000 mg | ORAL_TABLET | Freq: Once | ORAL | Status: AC
  Administered 2023-04-05: 1000 mg via ORAL
  Filled 2023-04-05: qty 2

## 2023-04-05 MED ORDER — OMEPRAZOLE MAGNESIUM 20 MG PO TBEC
20.0000 mg | DELAYED_RELEASE_TABLET | Freq: Every day | ORAL | 0 refills | Status: AC
Start: 2023-04-05 — End: 2023-05-05

## 2023-04-05 MED ORDER — LIDOCAINE VISCOUS HCL 2 % MT SOLN
15.0000 mL | Freq: Once | OROMUCOSAL | Status: AC
  Administered 2023-04-05: 15 mL via ORAL
  Filled 2023-04-05: qty 15

## 2023-04-05 NOTE — Discharge Instructions (Signed)
You were seen today for your chest pain symptoms.  Workup shows no evidence of damage to your heart or lungs.  This could possibly be musculoskeletal pain or reflux or gastritis symptoms.  I have sent a medication to your pharmacy for you to take as scheduled for the next 30 days.  You can also take Tylenol and ibuprofen over the next 4 to 5 days to help with symptoms.  Please use the resource guide provided and call to establish care with a primary care doctor for ongoing management outpatient.

## 2023-04-05 NOTE — ED Provider Notes (Signed)
[facility] Provider Note    Event Date/Time   First MD Initiated Contact with Patient 04/05/23 1230     (approximate)   History   Chest Pain   HPI Phillip Rose is a 32 y.o. male presenting today for intermittent chest pain.  Patient states for the past week he has had intermittent left-sided chest pain that feels like a pinching sensation.  Had some nausea but not is currently present at this time.  Otherwise denies shortness of breath, fever, chills, vomiting, abdominal pain, diarrhea, constipation.  No recent sick contacts.  Stated he took one of his father's reflux pills at home which did help with his symptoms but they came back afterwards.     Physical Exam   Triage Vital Signs: ED Triage Vitals [04/05/23 1215]  Encounter Vitals Group     BP (!) 160/91     Systolic BP Percentile      Diastolic BP Percentile      Pulse Rate (!) 115     Resp 20     Temp 97.7 F (36.5 C)     Temp Source Oral     SpO2 95 %     Weight 230 lb (104.3 kg)     Height 5\' 5"  (1.651 m)     Head Circumference      Peak Flow      Pain Score 4     Pain Loc      Pain Education      Exclude from Growth Chart     Most recent vital signs: Vitals:   04/05/23 1215  BP: (!) 160/91  Pulse: (!) 115  Resp: 20  Temp: 97.7 F (36.5 C)  SpO2: 95%   Physical Exam: I have reviewed the vital signs and nursing notes. General: Awake, alert, no acute distress.  Nontoxic appearing. Head:  Atraumatic, normocephalic.   ENT:  EOM intact, PERRL. Oral mucosa is pink and moist with no lesions. Neck: Neck is supple with full range of motion, No meningeal signs. Cardiovascular:  RRR, No murmurs. Peripheral pulses palpable and equal bilaterally. Respiratory:  Symmetrical chest wall expansion.  No rhonchi, rales, or wheezes.  Good air movement throughout.  No use of accessory muscles.   Musculoskeletal:  No cyanosis or edema. Moving extremities with full ROM Abdomen:  Soft, nontender,  nondistended. Neuro:  GCS 15, moving all four extremities, interacting appropriately. Speech clear. Psych:  Calm, appropriate.   Skin:  Warm, dry, no rash.    ED Results / Procedures / Treatments   Labs (all labs ordered are listed, but only abnormal results are displayed) Labs Reviewed  BASIC METABOLIC PANEL - Abnormal; Notable for the following components:      Result Value   Glucose, Bld 118 (*)    All other components within normal limits  CBC - Abnormal; Notable for the following components:   Hemoglobin 17.7 (*)    All other components within normal limits  TROPONIN I (HIGH SENSITIVITY)     EKG My EKG interpretation: Rate of 123, sinus tachycardia, normal axis, normal intervals.  No acute ST elevations or depressions.  Overall comparable to EKG on 03/25/2023   RADIOLOGY Independently interpreted chest x-ray with no acute pathology   PROCEDURES:  Critical Care performed: No  Procedures   MEDICATIONS ORDERED IN ED: Medications  alum & mag hydroxide-simeth (MAALOX/MYLANTA) 200-200-20 MG/5ML suspension 30 mL (30 mLs Oral Given 04/05/23 1311)    And  lidocaine (XYLOCAINE) 2 % viscous  mouth solution 15 mL (15 mLs Oral Given 04/05/23 1312)  famotidine (PEPCID) tablet 20 mg (20 mg Oral Given 04/05/23 1310)  acetaminophen (TYLENOL) tablet 1,000 mg (1,000 mg Oral Given 04/05/23 1310)     IMPRESSION / MDM / ASSESSMENT AND PLAN / ED COURSE  I reviewed the triage vital signs and the nursing notes.                              Differential diagnosis includes, but is not limited to, gastritis, pneumonia, pneumothorax, reflux, musculoskeletal pain, less likely ACS  Patient's presentation is most consistent with acute complicated illness / injury requiring diagnostic workup.  Patient is a 32 year old male presenting today with intermittent chest pain associated with some nausea and improving outpatient with Pepcid.  Most likely related to gastritis or reflux symptoms.   Separately, does work [employer] some pain when moving which could indicate a musculoskeletal strain.  No specific associated symptoms to highly suggest ACS and patient's EKG and troponin were unremarkable for ischemia.  CBC and BMP reassuring.  Chest x-ray shows no acute pathology.  Patient was given a GI cocktail here in the emergency department with improvement in symptoms.  Safe for discharge and will send out on omeprazole.  Patient was given resource guide for PCP establishment.  Given strict return precautions for worsening symptoms.  The patient is on the cardiac monitor to evaluate for evidence of arrhythmia and/or significant heart rate changes. Clinical Course as of 04/05/23 1340  Sun Apr 05, 2023  1255 Troponin I (High Sensitivity): <2 Negative [DW]  1255 CBC(!) Unremarkable [DW]  1255 Basic metabolic panel(!) Unremarkable [DW]  1255 DG Chest 2 View Independently interpreted with no acute cardiopulmonary abnormalities [DW]  1338 Symptomatically feeling better after GI cocktail [DW]    Clinical Course User Index [DW] Janith Lima, MD     FINAL CLINICAL IMPRESSION(S) / ED DIAGNOSES   Final diagnoses:  Chest pain, unspecified type     Rx / DC Orders   ED Discharge Orders          Ordered    omeprazole (PRILOSEC OTC) 20 MG tablet  Daily        04/05/23 1339    Ambulatory Referral to Primary Care (Establish Care)        04/05/23 1340             Note:  This document was prepared using Dragon voice recognition software and may include unintentional dictation errors.   Janith Lima, MD 04/05/23 [phone]

## 2023-04-05 NOTE — ED Triage Notes (Addendum)
Patient c/o stabbing chest pain and burning in right side of neck. Reports the cp has been intermittent over a week.  History of anxiety

## 2023-04-24 ENCOUNTER — Other Ambulatory Visit: Payer: Self-pay

## 2023-04-24 ENCOUNTER — Emergency Department: Payer: Managed Care, Other (non HMO)

## 2023-04-24 ENCOUNTER — Emergency Department
Admission: EM | Admit: 2023-04-24 | Discharge: 2023-04-24 | Disposition: A | Discharge status: Home / Self Care | Payer: Managed Care, Other (non HMO) | Attending: Emergency Medicine | Admitting: Emergency Medicine

## 2023-04-24 DIAGNOSIS — R079 Chest pain, unspecified: Secondary | ICD-10-CM | POA: Insufficient documentation

## 2023-04-24 LAB — BASIC METABOLIC PANEL
Anion gap: 8 (ref 5–15)
BUN: 12 mg/dL (ref 6–20)
CO2: 26 mmol/L (ref 22–32)
Calcium: 9.3 mg/dL (ref 8.9–10.3)
Chloride: 106 mmol/L (ref 98–111)
Creatinine, Ser: 0.99 mg/dL (ref 0.61–1.24)
GFR, Estimated: >60 mL/min (ref >60)
Glucose, Bld: 97 mg/dL (ref 70–99)
Potassium: 3.9 mmol/L (ref 3.5–5.1)
Sodium: 140 mmol/L (ref 135–145)

## 2023-04-24 LAB — CBC
HCT: 50.7 % (ref 39.0–52.0)
Hemoglobin: 17.3 g/dL — ABNORMAL HIGH (ref 13.0–17.0)
MCH: 30.2 pg (ref 26.0–34.0)
MCHC: 34.1 g/dL (ref 30.0–36.0)
MCV: 88.6 fL (ref 80.0–100.0)
Platelets: 266 10*3/uL (ref 150–400)
RBC: 5.72 MIL/uL (ref 4.22–5.81)
RDW: 11.9 % (ref 11.5–15.5)
WBC: 7.7 10*3/uL (ref 4.0–10.5)
nRBC: 0 % (ref 0.0–0.2)

## 2023-04-24 LAB — TROPONIN I (HIGH SENSITIVITY)
Troponin I (High Sensitivity): 3 ng/L (ref <18)
Troponin I (High Sensitivity): 2 ng/L (ref <18)

## 2023-04-24 NOTE — ED Triage Notes (Signed)
Pt here with cp since this morning. Pt states pain is left sided and radiates down his left arm. Pt states pain is pressure. Pt states pain is intermittent. Pt endorses nausea and diarrhea 2 days ago.

## 2023-04-24 NOTE — ED Provider Notes (Signed)
   [facility] Provider Note    Event Date/Time   First MD Initiated Contact with Patient 04/24/23 1038     (approximate)   History   Chest Pain   HPI  Phillip Rose is a 32 y.o. male no significant past medical history who presents to the emergency department with chest pain.  Chest pain this morning when he woke up complaining of substernal chest pressure that radiates to the left arm.     Physical Exam   Triage Vital Signs: ED Triage Vitals  Encounter Vitals Group     BP 04/24/23 0856 (!) 141/96     Systolic BP Percentile --      Diastolic BP Percentile --      Pulse Rate 04/24/23 0856 77     Resp 04/24/23 0856 18     Temp 04/24/23 0856 98.7 F (37.1 C)     Temp Source 04/24/23 0856 Oral     SpO2 04/24/23 0856 96 %     Weight 04/24/23 0854 229 lb 15 oz (104.3 kg)     Height 04/24/23 0854 5\' 5"  (1.651 m)     Head Circumference --      Peak Flow --      Pain Score 04/24/23 0854 3     Pain Loc --      Pain Education --      Exclude from Growth Chart --     Most recent vital signs: Vitals:   04/24/23 0856  BP: (!) 141/96  Pulse: 77  Resp: 18  Temp: 98.7 F (37.1 C)  SpO2: 96%    Physical Exam  IMPRESSION / MDM / ASSESSMENT AND PLAN / ED COURSE  I reviewed the triage vital signs and the nursing notes.  *** EKG  I, Corena Herter, the attending physician, personally viewed and interpreted this ECG.   Rate: Normal  Rhythm: Normal sinus  Axis: Normal  Intervals: Normal  ST&T Change: None  No tachycardic or bradycardic dysrhythmias while on cardiac telemetry.  RADIOLOGY I independently reviewed imaging, my interpretation of imaging: ***  LABS (all labs ordered are listed, but only abnormal results are displayed) Labs interpreted as -    Labs Reviewed  CBC - Abnormal; Notable for the following components:      Result Value   Hemoglobin 17.3 (*)    All other components within normal limits  BASIC METABOLIC PANEL   TROPONIN I (HIGH SENSITIVITY)  TROPONIN I (HIGH SENSITIVITY)     MDM       PROCEDURES:  Critical Care performed: No  Procedures  Patient's presentation is most consistent with {EM COPA:27473}   MEDICATIONS ORDERED IN ED: Medications - No data to display  FINAL CLINICAL IMPRESSION(S) / ED DIAGNOSES   Final diagnoses:  None     Rx / DC Orders   ED Discharge Orders     None        Note:  This document was prepared using Dragon voice recognition software and may include unintentional dictation errors.

## 2023-04-24 NOTE — Discharge Instructions (Addendum)
You are seen in the emergency department for chest pain.  You had 2 heart enzymes that were normal, do not believe you are having a heart attack today.  Your chest pain symptoms went away.  Your chest x-ray did not show any signs of pneumonia.  The rest of your lab work was at your normal -you do have an elevated hemoglobin level, follow-up as an outpatient with a primary care physician.  If you have return of your symptoms return to the emergency department for reevaluation.  Your blood pressure was mildly elevated in the emergency department, you are given information for primary care provider, follow-up closely with primary care provider so they can recheck your blood pressure.  Thank you for choosing [place] for your health care, it was my pleasure to care for you today!  Corena Herter, MD
